# Patient Record
Sex: Male | Born: 1970 | Race: White | Hispanic: No | Smoking: Former smoker
Health system: Southern US, Community
[De-identification: ages and names within clinical notes are randomized; demographics above are authoritative.]

---

## 2007-09-04 ENCOUNTER — Emergency Department (HOSPITAL_COMMUNITY): Admission: EM | Admit: 2007-09-04 | Discharge: 2007-09-05 | Payer: Self-pay | Admitting: Emergency Medicine

## 2011-09-17 ENCOUNTER — Other Ambulatory Visit (HOSPITAL_COMMUNITY): Payer: Self-pay | Admitting: Orthopedic Surgery

## 2011-09-17 ENCOUNTER — Other Ambulatory Visit: Payer: Self-pay | Admitting: Orthopedic Surgery

## 2011-09-17 DIAGNOSIS — M79645 Pain in left finger(s): Secondary | ICD-10-CM

## 2011-09-23 ENCOUNTER — Ambulatory Visit (HOSPITAL_COMMUNITY)
Admission: RE | Admit: 2011-09-23 | Discharge: 2011-09-23 | Disposition: A | Discharge status: Home / Self Care | Payer: No Typology Code available for payment source | Source: Ambulatory Visit | Attending: Orthopedic Surgery | Admitting: Orthopedic Surgery

## 2011-09-23 DIAGNOSIS — M79645 Pain in left finger(s): Secondary | ICD-10-CM

## 2011-09-23 DIAGNOSIS — M79609 Pain in unspecified limb: Secondary | ICD-10-CM | POA: Insufficient documentation

## 2011-09-23 MED ORDER — IOHEXOL 180 MG/ML  SOLN
0.5000 mL | Freq: Once | INTRAMUSCULAR | Status: AC | PRN
  Administered 2011-09-23: 0.5 mL via INTRA_ARTICULAR

## 2011-09-23 MED ORDER — GADOBENATE DIMEGLUMINE 529 MG/ML IV SOLN
0.0500 mL | Freq: Once | INTRAVENOUS | Status: AC | PRN
  Administered 2011-09-23: 0.05 mL via INTRAVENOUS

## 2011-09-23 NOTE — Procedures (Signed)
Injection of the left first MCP joint under fluoroscopy was performed for MR arthrogram without complication.

## 2011-09-25 ENCOUNTER — Other Ambulatory Visit: Payer: Self-pay

## 2011-10-05 ENCOUNTER — Other Ambulatory Visit (HOSPITAL_COMMUNITY): Payer: Self-pay | Admitting: Orthopedic Surgery

## 2011-10-05 DIAGNOSIS — M79646 Pain in unspecified finger(s): Secondary | ICD-10-CM

## 2011-10-07 ENCOUNTER — Other Ambulatory Visit (HOSPITAL_COMMUNITY): Payer: Self-pay | Admitting: Orthopedic Surgery

## 2011-10-07 ENCOUNTER — Ambulatory Visit (HOSPITAL_COMMUNITY): Payer: No Typology Code available for payment source

## 2011-10-07 ENCOUNTER — Ambulatory Visit (HOSPITAL_COMMUNITY)
Admission: RE | Admit: 2011-10-07 | Discharge: 2011-10-07 | Disposition: A | Discharge status: Home / Self Care | Payer: No Typology Code available for payment source | Source: Ambulatory Visit | Attending: Orthopedic Surgery | Admitting: Orthopedic Surgery

## 2011-10-07 DIAGNOSIS — M79646 Pain in unspecified finger(s): Secondary | ICD-10-CM

## 2011-10-07 DIAGNOSIS — M79645 Pain in left finger(s): Secondary | ICD-10-CM

## 2011-10-07 DIAGNOSIS — X58XXXA Exposure to other specified factors, initial encounter: Secondary | ICD-10-CM | POA: Insufficient documentation

## 2011-10-07 DIAGNOSIS — M79609 Pain in unspecified limb: Secondary | ICD-10-CM | POA: Insufficient documentation

## 2011-10-07 MED ORDER — TECHNETIUM TC 99M MEDRONATE IV KIT
25.0000 | PACK | Freq: Once | INTRAVENOUS | Status: AC | PRN
  Administered 2011-10-07: 25 via INTRAVENOUS

## 2013-06-07 IMAGING — CR DG HAND 2V*L*
2 series · 2 of 2 positions shown · non-contrast
Comparison: Today's three-phase bone scan

CLINICAL DATA: Left thumb pain, injured ligaments in the left hand
previously

LEFT HAND - 2 VIEW

[x hand ap left]
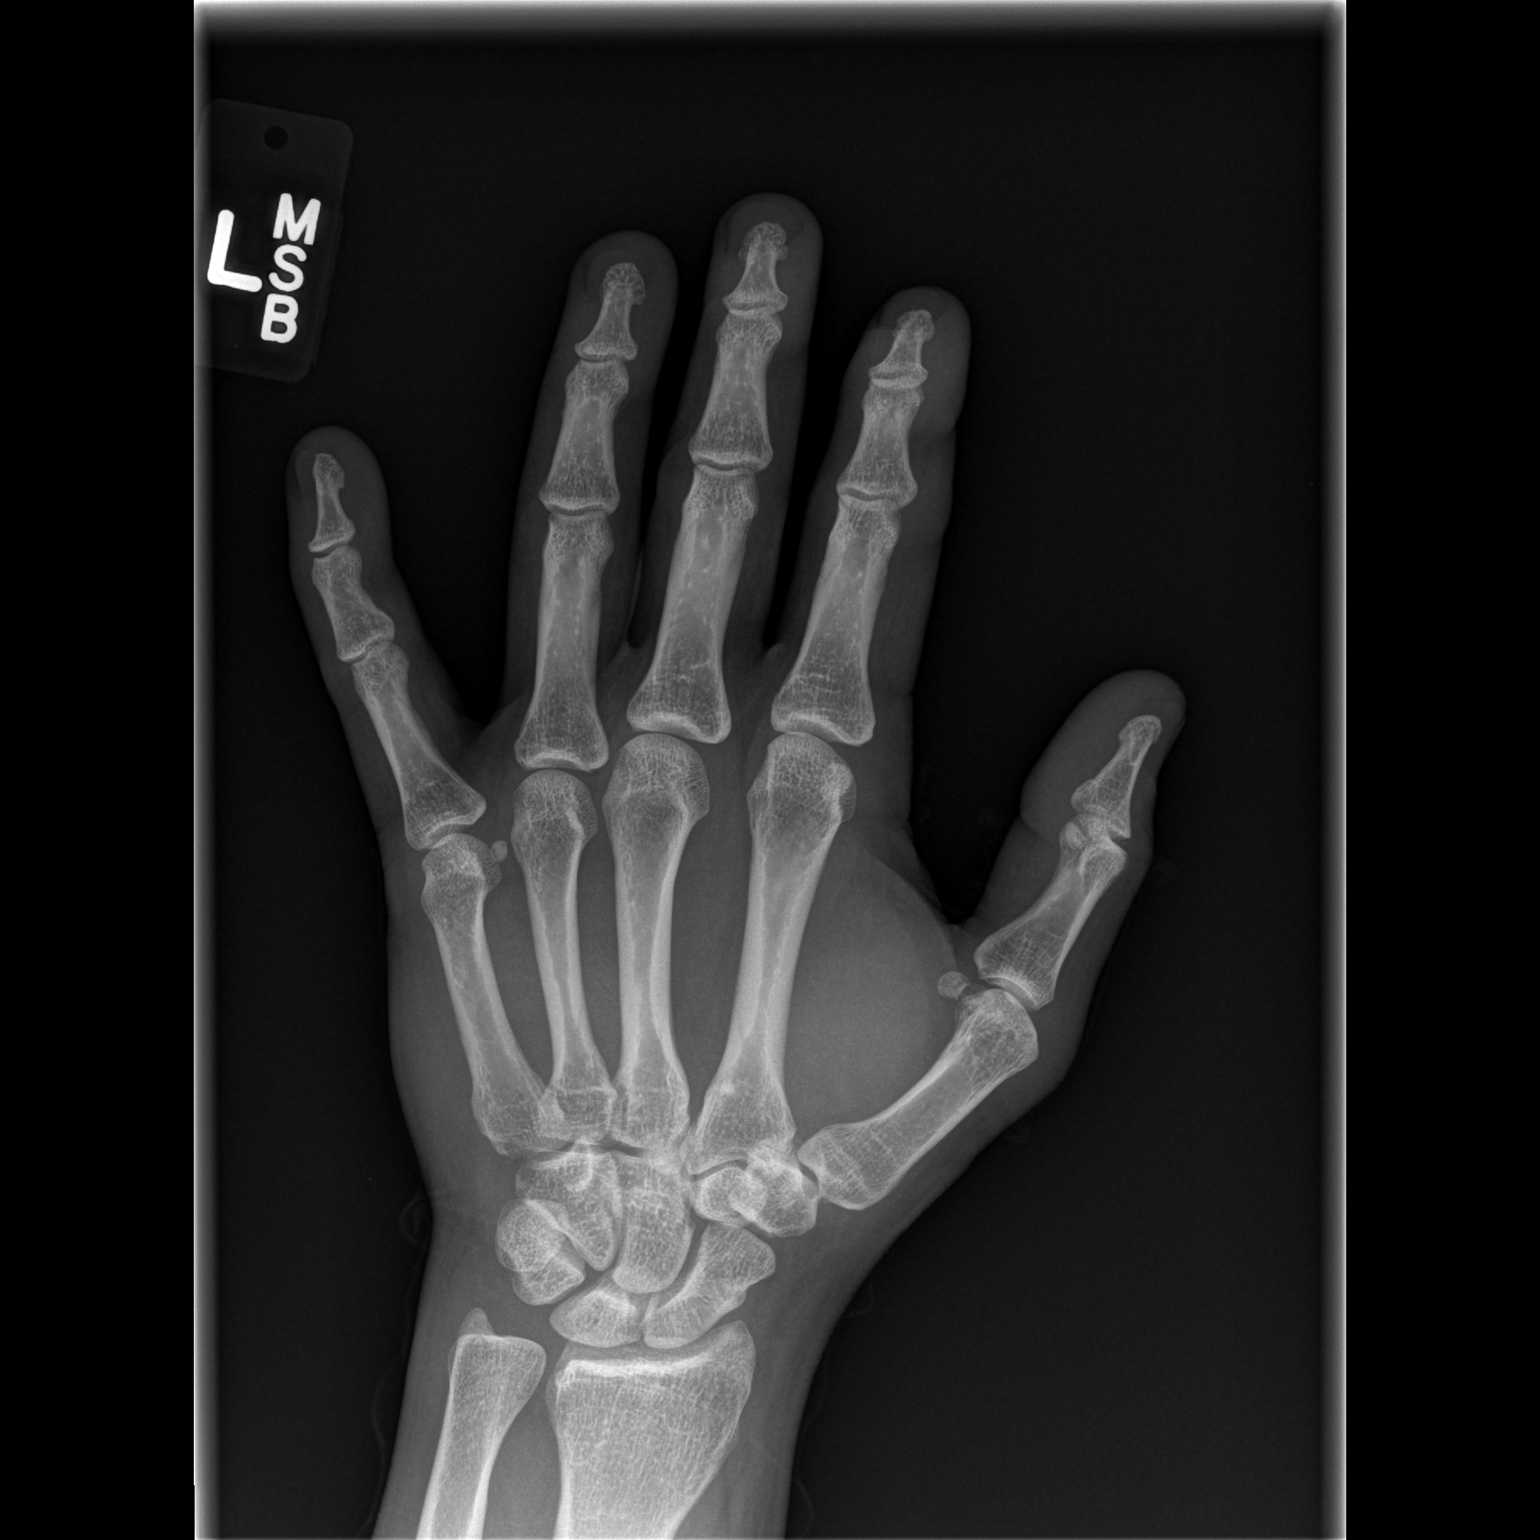

[x hand lat left]
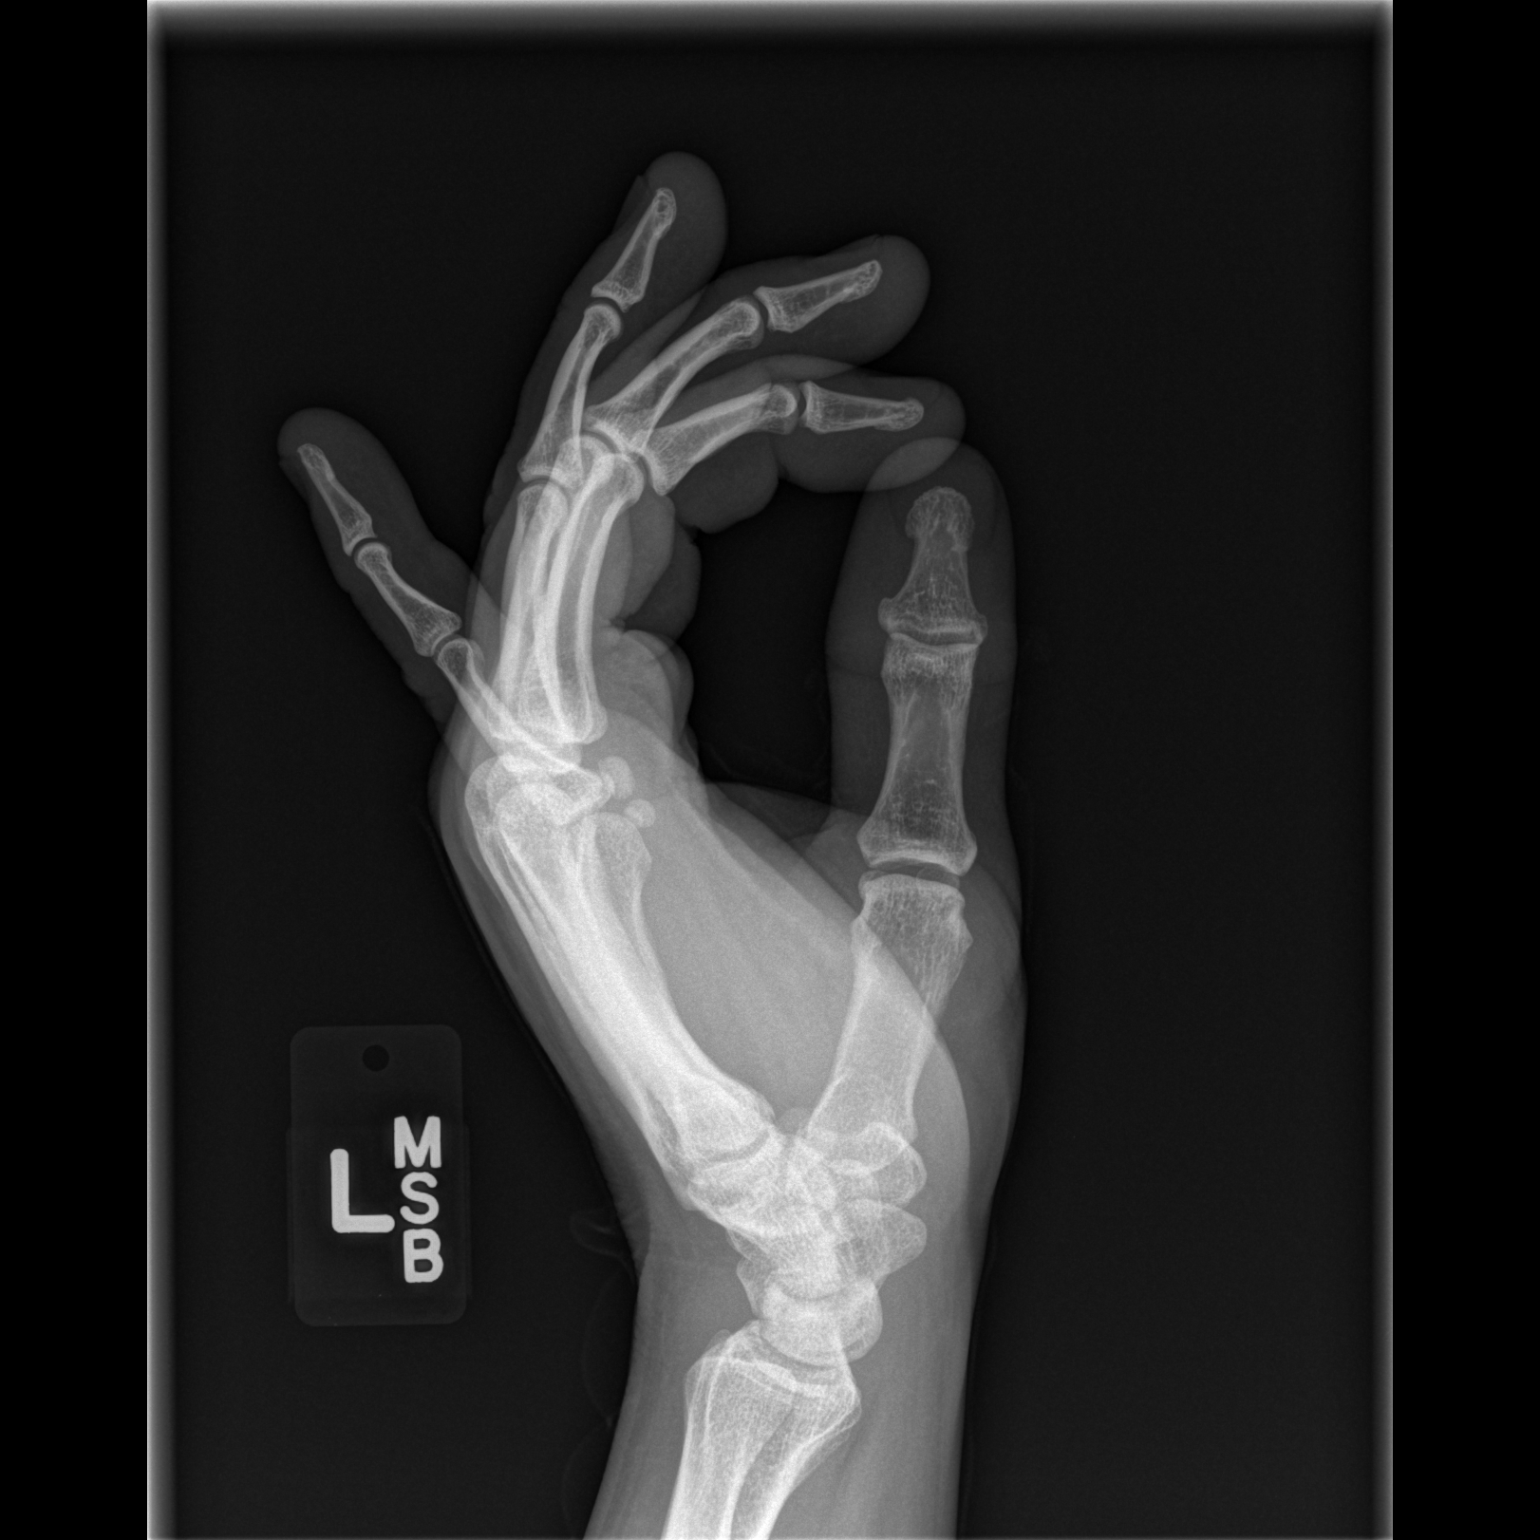

[2 of 2 positions shown; findings below may reference images not displayed]

FINDINGS: Compared to today's bone scan no acute bony abnormality
is seen.  The focus of activity at the base of the first metacarpal
on today's bone scan most likely relates to the contusion noted on
MR.  Alignment is normal and joint spaces appear normal.
IMPRESSION: No acute bony abnormality.

## 2013-06-07 IMAGING — NM NM BONE 3 PHASE
1 series · 6 of 6 positions shown · non-contrast
Comparison: MRI dated 09/23/2011

CLINICAL DATA: Left thumb pain due to trauma on and 09/06/2011.

NUCLEAR MEDICINE THREE PHASE BONE SCAN
TECHNIQUE: Radionuclide angiographic images, immediate static
blood pool images, and 3-hour delayed static images were obtained
after intravenous injection of radiopharmaceutical.
Radiopharmaceutical: [id number] BRE HORAN TECHNETIUM TC 99M
MEDRONATE IV KIT

[Series 1: bone fl0w · 4.46mm/px · 6 of 40 frames shown]
[frame 4/40]
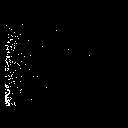
[frame 10/40]
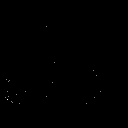
[frame 17/40]
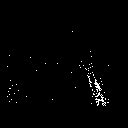
[frame 24/40]
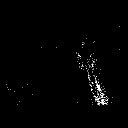
[frame 30/40]
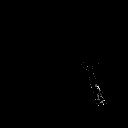
[frame 37/40]
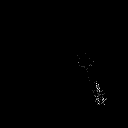

[6 of 6 positions shown; findings below may reference images not displayed]

FINDINGS: Perfusion images demonstrate no abnormal perfusion to the
left hand or wrist.  Blood pool images demonstrate no abnormal
activity in the left hand or wrist.

Delayed bone images demonstrate focal abnormal activity in the
distal first metacarpal of the left hand consistent with the bone
contusions seen on the MRI.

There is a tiny focal area of increased activity on the dorsal
aspect of the right hand, probably at the site of injection of the
radiopharmaceutical.
IMPRESSION: Abnormal activity in the distal aspect of the first metacarpal of
the left hand consistent with bone contusion.

## 2021-10-29 ENCOUNTER — Ambulatory Visit (INDEPENDENT_AMBULATORY_CARE_PROVIDER_SITE_OTHER): Payer: 59

## 2021-10-29 ENCOUNTER — Ambulatory Visit (INDEPENDENT_AMBULATORY_CARE_PROVIDER_SITE_OTHER): Payer: 59 | Admitting: Physician Assistant

## 2021-10-29 ENCOUNTER — Encounter: Payer: Self-pay | Admitting: Physician Assistant

## 2021-10-29 DIAGNOSIS — S92422A Displaced fracture of distal phalanx of left great toe, initial encounter for closed fracture: Secondary | ICD-10-CM | POA: Diagnosis not present

## 2021-10-29 NOTE — Progress Notes (Signed)
Office Visit Note   Patient: Jeremy Archer           Date of Birth: May 17, 1970           MRN: [id number] Visit Date: 10/29/2021              Requested by: No referring provider defined for this encounter. PCP: No primary care provider on file.  Chief Complaint  Patient presents with   Left Foot - Fracture    Great toe fracture      HPI: Patient is a pleasant 51 year old gentleman who presents 1 week status post getting his great toe caught in an air conditioning vent while chasing some dogs.  He was seen and evaluated at an urgent care.  He did have an open laceration does not probe to bone at the base of the toenail.  He was given instructions on daily dressing changes and bacitracin.  He was also placed on antibiotics.  He has that he thinks 1 more these to take.  Denies any fever or chills.  He was placed in a postop shoe and x-rays there demonstrated a minimally displaced fracture of the distal phalanx of the great toe.  Assessment & Plan: Visit Diagnoses:  1. Displaced fracture of distal phalanx of left great toe, initial encounter for closed fracture     Plan: Patient will continue with daily dressing changes.  No evidence of an infection today.  He will continue with the postop shoe.  Elevate as needed.  Follow-up in 2 weeks just prior to a trip he has planned to [facility].  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    Follow-Up Instructions: No follow-ups on file.   Ortho Exam  Patient is alert, oriented, no adenopathy, well-dressed, normal affect, normal respiratory effort. Examination he has a strong dorsalis pedis pulse.  He has soft tissue swelling of the left great toe.  He does have a W shaped laceration at the base of his nail.  This does not probe deeply.  It is very superficial.  There is minimal drainage that is bloody.   No purulent drainage no ascending cellulitis.  He is able to extend his toe actively and flex his toe actively  Imaging: XR Toe Great Left  Result Date: 10/29/2021 X-rays of his left great toe demonstrate minimally displaced fracture of the distal phalanx of the left great toe.  Some degenerative changes of the IP joint.  No other fractures are noted.  Fracture does not appear to be intra-articular  No images are attached to the encounter.  Labs: No results found for: "HGBA1C", "ESRSEDRATE", "CRP", "LABURIC", "REPTSTATUS", "GRAMSTAIN", "CULT", "LABORGA"   No results found for: "ALBUMIN", "PREALBUMIN", "CBC"  No results found for: "MG" No results found for: "VD25OH"  No results found for: "PREALBUMIN"     No data to display           There is no height or weight on file to calculate BMI.  Orders:  Orders Placed This Encounter  Procedures   XR Toe Great Left   No orders of the defined types were placed in this encounter.    Procedures: No procedures performed  Clinical Data: No additional findings.  ROS:  All other systems negative, except as noted in the HPI. Review of Systems  Objective: Vital Signs: There were no vitals taken for this visit.  Specialty Comments:  No specialty comments available.  PMFS History: Patient Active Problem List   Diagnosis Date Noted   Displaced fracture of distal phalanx of left great toe, initial encounter for closed fracture 10/29/2021   History reviewed. No pertinent past medical history.  History reviewed. No pertinent family history.  History reviewed. No pertinent surgical history. Social History   Occupational History   Not on file  Tobacco Use   Smoking status: Not on file   Smokeless tobacco: Not on file  Substance and Sexual Activity   Alcohol use: Not on file   Drug use: Not on file   Sexual activity: Not on file

## 2021-11-07 ENCOUNTER — Encounter: Payer: Self-pay | Admitting: Physician Assistant

## 2021-11-07 ENCOUNTER — Ambulatory Visit (INDEPENDENT_AMBULATORY_CARE_PROVIDER_SITE_OTHER): Payer: 59 | Admitting: Physician Assistant

## 2021-11-07 ENCOUNTER — Ambulatory Visit (INDEPENDENT_AMBULATORY_CARE_PROVIDER_SITE_OTHER): Payer: 59

## 2021-11-07 DIAGNOSIS — S92422A Displaced fracture of distal phalanx of left great toe, initial encounter for closed fracture: Secondary | ICD-10-CM

## 2021-11-07 DIAGNOSIS — Z89412 Acquired absence of left great toe: Secondary | ICD-10-CM

## 2021-11-07 MED ORDER — HYDROCODONE-ACETAMINOPHEN 5-325 MG PO TABS: 1.0000 | ORAL_TABLET | ORAL | 0 refills | Status: DC | PRN

## 2021-11-07 NOTE — Progress Notes (Signed)
Office Visit Note   Patient: Jeremy Archer           Date of Birth: 05-Apr-1970           MRN: [id number] Visit Date: 11/07/2021              Requested by: No referring provider defined for this encounter. PCP: No primary care provider on file.  Chief Complaint  Patient presents with   Left Foot - Follow-up      HPI: Patient presents today 2 weeks status post fracture of the distal phalanx of the left great toe.  He does have some soreness and numbness in the toe.  He has been using a postop shoe.  He denies any fever or chills.  He has completed antibiotics.  He does admit that he has stubbed his toe a couple times in the postop shoe  Assessment & Plan: Visit Diagnoses:  1. Left great toe amputee (HCC)   2. Displaced fracture of distal phalanx of left great toe, initial encounter for closed fracture     Plan:  continue with the postop shoe follow-up in 2 weeks new x-rays at that time.  He is going to [facility].  I have recommended limiting his weightbearing is much as possible.  Have advised him to get a cane.  He is also asking for pain medicine I will give him just a few hydrocodone.  He understands that he is not to push through pain and walk more because he is taking the medication.  Will not give him a refill.  Follow-Up Instructions: No follow-ups on file.   Ortho Exam  Patient is alert, oriented, no adenopathy, well-dressed, normal affect, normal respiratory effort. Examination of his left foot he does have some soft tissue swelling of the left great toe.  Laceration has healed well.  Pulses are intact.  He has some altered sensation in the great toe but has brisk capillary refill no signs of cellulitis or infection  Imaging: No results found. No images are attached to the encounter.  Labs: No results found for: "HGBA1C", "ESRSEDRATE", "CRP", "LABURIC", "REPTSTATUS", "GRAMSTAIN", "CULT", "LABORGA"   No results found for: "ALBUMIN", "PREALBUMIN", "CBC"  No  results found for: "MG" No results found for: "VD25OH"  No results found for: "PREALBUMIN"     No data to display           There is no height or weight on file to calculate BMI.  Orders:  Orders Placed This Encounter  Procedures   XR Toe Great Left   No orders of the defined types were placed in this encounter.    Procedures: No procedures performed  Clinical Data: No additional findings.  ROS:  All other systems negative, except as noted in the HPI. Review of Systems  Objective: Vital Signs: There were no vitals taken for this visit.  Specialty Comments:  No specialty comments available.  PMFS History: Patient Active Problem List   Diagnosis Date Noted   Displaced fracture of distal phalanx of left great toe, initial encounter for closed fracture 10/29/2021   History reviewed. No pertinent past medical history.  History reviewed. No pertinent family history.  History reviewed. No pertinent surgical history. Social History   Occupational History   Not on file  Tobacco Use   Smoking status: Not on file   Smokeless tobacco: Not on file  Substance and Sexual Activity   Alcohol use: Not on file   Drug use: Not on file  Sexual activity: Not on file

## 2021-11-20 ENCOUNTER — Ambulatory Visit (INDEPENDENT_AMBULATORY_CARE_PROVIDER_SITE_OTHER): Payer: 59 | Admitting: Orthopaedic Surgery

## 2021-11-20 ENCOUNTER — Encounter: Payer: Self-pay | Admitting: Orthopaedic Surgery

## 2021-11-20 DIAGNOSIS — S92422A Displaced fracture of distal phalanx of left great toe, initial encounter for closed fracture: Secondary | ICD-10-CM

## 2021-11-20 NOTE — Progress Notes (Signed)
Office Visit Note   Patient: Jeremy Archer           Date of Birth: 05/19/1970           MRN: [id number] Visit Date: 11/20/2021              Requested by: Valeria Batman, MD [address] [place],  [place] [address] PCP: Pcp, No   Assessment & Plan: Visit Diagnoses:  1. Displaced fracture of distal phalanx of left great toe, initial encounter for closed fracture     Plan: 1 month status post injury to left great toe.  Injury sustained at home.  This was not an on-the-job injury.  Prior films have demonstrated a comminuted fracture of the distal phalanx of the great toe.  There was some intra-articular extension but without displacement of that segment.  Doing relatively well with a wooden, postop shoe.  Still has some swelling but has good sensation.  Initially had an open wound but has healed and no evidence of infection.  His work requires him to climb and lift heavy objects I do not think he can do that as yet.  We will plan to see him back in 2 weeks and reassess his ability to return to work.  We will give him a note to stay out until then.  May transition to a more comfortable shoe as needed  Follow-Up Instructions: No follow-ups on file.   Orders:  No orders of the defined types were placed in this encounter.  No orders of the defined types were placed in this encounter.     Procedures: No procedures performed   Clinical Data: No additional findings.   Subjective: Chief Complaint  Patient presents with   Left Foot - Follow-up   Patient presents today for follow up of his left great toe fracture. Patient states that he has been having continued sore and throbbing pains. He has been prescribed hydrocodone for pain which is giving relief. Post op shoe is continued to be worn.  Review of Systems   Objective: Vital Signs: There were no vitals taken for this visit.  Physical Exam Constitutional:      Appearance: He is well-developed.  Eyes:     Pupils:  Pupils are equal, round, and reactive to light.  Pulmonary:     Effort: Pulmonary effort is normal.  Skin:    General: Skin is warm and dry.  Neurological:     Mental Status: He is alert and oriented to person, place, and time.  Psychiatric:        Behavior: Behavior normal.     Ortho Exam left great toe had some mild to moderate swelling distally.  No pain about the proximal phalanx but still has some discomfort at the distal phalanx.  Nail is intact.  Good sensation.  No open areas or evidence of infection.  No obvious deformity  Specialty Comments:  No specialty comments available.  Imaging: No results found.   PMFS History: Patient Active Problem List   Diagnosis Date Noted   Displaced fracture of distal phalanx of left great toe, initial encounter for closed fracture 10/29/2021   History reviewed. No pertinent past medical history.  History reviewed. No pertinent family history.  History reviewed. No pertinent surgical history. Social History   Occupational History   Not on file  Tobacco Use   Smoking status: Not on file   Smokeless tobacco: Not on file  Substance and Sexual Activity   Alcohol use:  Not on file   Drug use: Not on file   Sexual activity: Not on file

## 2021-12-04 ENCOUNTER — Ambulatory Visit (INDEPENDENT_AMBULATORY_CARE_PROVIDER_SITE_OTHER): Payer: Commercial Managed Care - HMO | Admitting: Physician Assistant

## 2021-12-04 ENCOUNTER — Encounter: Payer: Self-pay | Admitting: Physician Assistant

## 2021-12-04 DIAGNOSIS — S92422A Displaced fracture of distal phalanx of left great toe, initial encounter for closed fracture: Secondary | ICD-10-CM

## 2021-12-04 NOTE — Progress Notes (Signed)
Office Visit Note   Patient: Jeremy Archer           Date of Birth: [birth year]           MRN: [id number] Visit Date: 12/04/2021              Requested by: No referring provider defined for this encounter. PCP: Pcp, No  Chief Complaint  Patient presents with   Follow-up    left great toe fx      HPI: Jeremy Archer returns today for follow-up of his left great toe distal phalanx fracture.  He is now over 5 weeks since the injury.  He did try putting his work boots down yesterday and did have some soreness.  He is wearing a regular shoe today.  He still has expected swelling.  We will discuss today return to work.  He does work on a job that requires him to climb ladders and lift up to 75 pounds.  Assessment & Plan: Visit Diagnoses:  1. Displaced fracture of distal phalanx of left great toe, initial encounter for closed fracture     Plan: I had a discussion with the patient.  We agreed that he could return to work this coming Monday however he would have limits of no climbing ladders or lifting over 20 pounds for 2 weeks.  Then he could be released to full duties.  This is what is anticipated.  I have told him if he feels he cannot do this or feels unsafe to contact me and we can modify these restrictions  Follow-Up Instructions: No follow-ups on file.   Ortho Exam  Patient is alert, oriented, no adenopathy, well-dressed, normal affect, normal respiratory effort. Examination of his left great toe skin is intact toe has mild soft tissue swelling but no ecchymosis.  He is able to wiggle his toe without difficulty.  He does have findings consistent with beginning regrowth of a new nail.  He has brisk capillary refill.  Some mild sensation change.  No cellulitis or evidence of infection  Imaging: No results found. No images are attached to the encounter.  Labs: No results found for: "HGBA1C", "ESRSEDRATE", "CRP", "LABURIC", "REPTSTATUS", "GRAMSTAIN", "CULT", "LABORGA"   No results found  for: "ALBUMIN", "PREALBUMIN", "CBC"  No results found for: "MG" No results found for: "VD25OH"  No results found for: "PREALBUMIN"     No data to display           There is no height or weight on file to calculate BMI.  Orders:  No orders of the defined types were placed in this encounter.  No orders of the defined types were placed in this encounter.    Procedures: No procedures performed  Clinical Data: No additional findings.  ROS:  All other systems negative, except as noted in the HPI. Review of Systems  Objective: Vital Signs: There were no vitals taken for this visit.  Specialty Comments:  No specialty comments available.  PMFS History: Patient Active Problem List   Diagnosis Date Noted   Displaced fracture of distal phalanx of left great toe, initial encounter for closed fracture 10/29/2021   History reviewed. No pertinent past medical history.  History reviewed. No pertinent family history.  History reviewed. No pertinent surgical history. Social History   Occupational History   Not on file  Tobacco Use   Smoking status: Not on file   Smokeless tobacco: Not on file  Substance and Sexual Activity   Alcohol use: Not on file  Drug use: Not on file   Sexual activity: Not on file

## 2022-01-20 ENCOUNTER — Ambulatory Visit: Payer: Commercial Managed Care - HMO | Admitting: Family Medicine

## 2022-02-23 ENCOUNTER — Encounter: Payer: Self-pay | Admitting: Family Medicine

## 2022-02-23 ENCOUNTER — Ambulatory Visit (INDEPENDENT_AMBULATORY_CARE_PROVIDER_SITE_OTHER): Payer: Commercial Managed Care - HMO | Admitting: Family Medicine

## 2022-02-23 VITALS — BP 138/90 | HR 85 | Temp 98.3°F | Ht 69.5 in | Wt 228.6 lb

## 2022-02-23 DIAGNOSIS — R030 Elevated blood-pressure reading, without diagnosis of hypertension: Secondary | ICD-10-CM | POA: Insufficient documentation

## 2022-02-23 DIAGNOSIS — Z800 Family history of malignant neoplasm of digestive organs: Secondary | ICD-10-CM

## 2022-02-23 DIAGNOSIS — Z1211 Encounter for screening for malignant neoplasm of colon: Secondary | ICD-10-CM

## 2022-02-23 LAB — LIPID PANEL
Cholesterol: 206 mg/dL — ABNORMAL HIGH (ref 0–200)
HDL: 93.8 mg/dL (ref >39.00)
LDL Cholesterol: 96 mg/dL (ref 0–99)
NonHDL: 112.62
Total CHOL/HDL Ratio: 2
Triglycerides: 84 mg/dL (ref 0.0–149.0)
VLDL: 16.8 mg/dL (ref 0.0–40.0)

## 2022-02-23 LAB — COMPREHENSIVE METABOLIC PANEL
ALT: 26 U/L (ref 0–53)
AST: 22 U/L (ref 0–37)
Albumin: 4.5 g/dL (ref 3.5–5.2)
Alkaline Phosphatase: 65 U/L (ref 39–117)
BUN: 16 mg/dL (ref 6–23)
CO2: 26 mEq/L (ref 19–32)
Calcium: 9.3 mg/dL (ref 8.4–10.5)
Chloride: 105 mEq/L (ref 96–112)
Creatinine, Ser: 1.07 mg/dL (ref 0.40–1.50)
GFR: 81.2 mL/min (ref >60.00)
Glucose, Bld: 91 mg/dL (ref 70–99)
Potassium: 4.2 mEq/L (ref 3.5–5.1)
Sodium: 140 mEq/L (ref 135–145)
Total Bilirubin: 0.3 mg/dL (ref 0.2–1.2)
Total Protein: 7.6 g/dL (ref 6.0–8.3)

## 2022-02-23 LAB — HEMOGLOBIN A1C: Hgb A1c MFr Bld: 6 % (ref 4.6–6.5)

## 2022-02-23 NOTE — Assessment & Plan Note (Signed)
BP is elevated in office today. Patient states he does have a BP cuff at home. I advised that he check it daily for the next few weeks and to contact me if his BP remains >140/90. Pt has a family history of HTN in his father and he was given a handout on dietary and lifestyle modifications to help lower blood pressure

## 2022-02-23 NOTE — Assessment & Plan Note (Addendum)
First degree relative. I will send patient to GI for further recommendations for pancreatic cancer screening and colonoscopy screening. I have ordered CMP to check liver function tests. Ordering CMP, lipid panel and A1C for screening.

## 2022-02-23 NOTE — Progress Notes (Signed)
New Patient Office Visit  Subjective    Patient ID: Jeremy Archer, male    DOB: 02/01/1971  Age: 51 y.o. MRN: [id number]  CC:  Chief Complaint  Patient presents with   Establish Care    HPI Jeremy Archer presents to establish care Patient has not been to see a physician in some time, states that his hands are turning red BL, especially when he hangs them down at his side. He denies any numbness, tingling, or burning of his hands. States that he is constantly washing his hands also.   Pt reports that he had injured his right side after a fall. States that he had a CXR which was negative for rib fracture.   We reviewed his HM and his family history, he rpeorts his mom was diagnosed with pancreatic cancer in her 2's. She passed away at 65 due to this.   Pt reports he drinks at least 2 drinks per day. Has been doing this for some time. He reports he was told in the past that he had "fatty liver" and underwent ultrasound for this. Reports he has been checked for hepatitis in the past also.   Outpatient Encounter Medications as of 02/23/2022  Medication Sig   [DISCONTINUED] HYDROcodone-acetaminophen (NORCO/VICODIN) 5-325 MG tablet Take 1 tablet by mouth every 4 (four) hours as needed for moderate pain.   No facility-administered encounter medications on file as of 02/23/2022.    History reviewed. No pertinent past medical history.  History reviewed. No pertinent surgical history.  Family History  Problem Relation Age of Onset   Diabetes Mother    Cancer Mother    Pancreatic cancer Mother    Hypertension Father     Social History   Socioeconomic History   Marital status: Widowed    Spouse name: Not on file   Number of children: Not on file   Years of education: Not on file   Highest education level: Not on file  Occupational History   Not on file  Tobacco Use   Smoking status: Former    Types: Cigarettes   Smokeless tobacco: Never  Vaping Use   Vaping  Use: Never used  Substance and Sexual Activity   Alcohol use: Yes   Drug use: Not Currently   Sexual activity: Yes  Other Topics Concern   Not on file  Social History Narrative   Not on file   Social Determinants of Health   Financial Resource Strain: Not on file  Food Insecurity: Not on file  Transportation Needs: Not on file  Physical Activity: Not on file  Stress: Not on file  Social Connections: Not on file  Intimate Partner Violence: Not on file    Review of Systems  All other systems reviewed and are negative.       Objective    BP (!) 138/90 (BP Location: Left Arm, Patient Position: Sitting, Cuff Size: Large)   Pulse 85   Temp 98.3 F (36.8 C) (Oral)   Ht 5' 9.5" (1.765 m)   Wt 228 lb 9.6 oz (103.7 kg)   SpO2 99%   BMI 33.27 kg/m   Physical Exam Vitals reviewed.  Constitutional:      Appearance: Normal appearance. He is well-groomed and normal weight.  Eyes:     Conjunctiva/sclera: Conjunctivae normal.  Neck:     Thyroid: No thyromegaly.  Cardiovascular:     Rate and Rhythm: Normal rate and regular rhythm.     Heart sounds: S1 normal  and S2 normal. No murmur heard. Pulmonary:     Effort: Pulmonary effort is normal.     Breath sounds: Normal breath sounds and air entry. No rales.  Abdominal:     General: Bowel sounds are normal.  Musculoskeletal:     Right lower leg: No edema.     Left lower leg: No edema.  Skin:    Comments: Mottled skin of BL hands, there are shallow skin cracks on his knuckles BL, some callous formation BL.   Neurological:     General: No focal deficit present.     Mental Status: He is alert and oriented to person, place, and time.     Gait: Gait is intact.  Psychiatric:        Mood and Affect: Mood and affect normal.         Assessment & Plan:   Problem List Items Addressed This Visit       Unprioritized   Family history of pancreatic cancer    First degree relative. I will send patient to GI for further  recommendations for pancreatic cancer screening and colonoscopy screening. I have ordered CMP to check liver function tests. Ordering CMP, lipid panel and A1C for screening.      Relevant Orders   Ambulatory referral to Gastroenterology   Hemoglobin A1c   Elevated blood pressure reading - Primary    BP is elevated in office today. Patient states he does have a BP cuff at home. I advised that he check it daily for the next few weeks and to contact me if his BP remains >140/90. Pt has a family history of HTN in his father and he was given a handout on dietary and lifestyle modifications to help lower blood pressure       Relevant Orders   CMP   Lipid Panel   Hemoglobin A1c   Other Visit Diagnoses     Colon cancer screening       Relevant Orders   Ambulatory referral to Gastroenterology       Return in about 1 year (around 02/24/2023) for Annual physical exam.   Karie Georges, MD

## 2022-02-23 NOTE — Patient Instructions (Signed)
Start checking your blood pressure daily, anything less than 140/90  is the goal. If your blood pressure is elevated above this number  consistently then you may be developing hypertension.

## 2022-02-24 NOTE — Progress Notes (Signed)
Total cholesterol is high but that's just because of the good cholesterol number. No medication is needed at this time, otherwise labs are normal.

## 2022-03-11 ENCOUNTER — Encounter: Payer: Self-pay | Admitting: Family Medicine

## 2022-03-12 ENCOUNTER — Ambulatory Visit: Payer: Commercial Managed Care - HMO

## 2022-04-08 ENCOUNTER — Ambulatory Visit: Payer: Self-pay | Admitting: Family Medicine
